# Patient Record
Sex: Male | Born: 1980 | Race: White | Hispanic: No | Marital: Single | Smoking: Never smoker
Health system: Southern US, Community
[De-identification: ages and names within clinical notes are randomized; demographics above are authoritative.]

## PROBLEM LIST (undated history)

## (undated) DIAGNOSIS — E785 Hyperlipidemia, unspecified: Secondary | ICD-10-CM

## (undated) DIAGNOSIS — K219 Gastro-esophageal reflux disease without esophagitis: Secondary | ICD-10-CM

## (undated) HISTORY — PX: HERNIA REPAIR: SHX51

---

## 2015-03-07 ENCOUNTER — Ambulatory Visit
Admission: EM | Admit: 2015-03-07 | Discharge: 2015-03-07 | Disposition: A | Discharge status: Home / Self Care | Payer: BLUE CROSS/BLUE SHIELD | Attending: Family Medicine | Admitting: Family Medicine

## 2015-03-07 ENCOUNTER — Ambulatory Visit: Payer: BLUE CROSS/BLUE SHIELD

## 2015-03-07 DIAGNOSIS — J209 Acute bronchitis, unspecified: Secondary | ICD-10-CM | POA: Diagnosis not present

## 2015-03-07 DIAGNOSIS — R05 Cough: Secondary | ICD-10-CM | POA: Diagnosis present

## 2015-03-07 MED ORDER — PREDNISONE 10 MG PO TABS: ORAL_TABLET | ORAL | Status: DC

## 2015-03-07 MED ORDER — ALBUTEROL SULFATE HFA 108 (90 BASE) MCG/ACT IN AERS: 2.0000 | INHALATION_SPRAY | RESPIRATORY_TRACT | Status: DC | PRN

## 2015-03-07 NOTE — Discharge Instructions (Signed)

## 2015-03-07 NOTE — ED Provider Notes (Signed)
CSN: [id number]     Arrival date & time 03/07/15  1222 History   First MD Initiated Contact with Patient 03/07/15 1304     Chief Complaint  Patient presents with  . Cough  . URI   (Consider location/radiation/quality/duration/timing/severity/associated sxs/prior Treatment) HPI        34 year old male presents complaining of cough for one week. This started as a sore throat which has resolved. Now he has a cough that is productive of sputum. He denies any fever, chills, NVD, chest pain, or shortness of breath. He has been around his mom who had a similar condition with a cough but he says hers was viral, he thinks he has a bacterial infection because the cough is productive. No recent travel. 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 No past medical history. He does not smoke  History reviewed. No pertinent past medical history. History reviewed. No pertinent past surgical history. History reviewed. No pertinent family history. History  Substance Use Topics  . Smoking status: Never Smoker   . Smokeless tobacco: Never Used  . Alcohol Use: 1.2 oz/week    2 Cans of beer per week     Comment: occasional    Review of Systems  Constitutional: Negative for fever and chills.  HENT: Positive for sore throat.   Respiratory: Positive for cough and chest tightness. Negative for shortness of breath.   Cardiovascular: Negative for chest pain.  Gastrointestinal: Negative for nausea, vomiting, abdominal pain and diarrhea.  Genitourinary: Positive for flank pain (mild, bilateral).  All other systems reviewed and are negative.   Allergies  Review of patient's allergies indicates no known allergies.  Home Medications   Prior to Admission medications   Medication Sig Start Date End Date Taking? Authorizing Provider  albuterol (PROVENTIL HFA;VENTOLIN HFA) 108 (90 BASE) MCG/ACT inhaler Inhale 2 puffs into the lungs every 4 (four) hours as needed for wheezing.  03/07/15   Graylon GoodZachary H Io Dieujuste, PA-C  predniSONE (DELTASONE) 10 MG tablet 4 tabs PO QD for 4 days; 3 tabs PO QD for 3 days; 2 tabs PO QD for 2 days; 1 tab PO QD for 1 day 03/07/15   Graylon GoodZachary H Imogene Gravelle, PA-C   BP 123/83 mmHg  Pulse 72  Temp(Src) 97 F (36.1 C) (Tympanic)  Resp 20  Ht 6\' 5"  (1.956 m)  Wt 210 lb (95.255 kg)  BMI 24.90 kg/m2  SpO2 100% Physical Exam  Constitutional: He is oriented to person, place, and time. He appears well-developed and well-nourished. No distress.  HENT:  Head: Normocephalic and atraumatic.  Right Ear: External ear normal.  Left Ear: External ear normal.  Nose: Nose normal. Right sinus exhibits no maxillary sinus tenderness and no frontal sinus tenderness. Left sinus exhibits no maxillary sinus tenderness and no frontal sinus tenderness.  Mouth/Throat: Uvula is midline and mucous membranes are normal. Posterior oropharyngeal erythema (mild) present. No oropharyngeal exudate.  Eyes: Conjunctivae are normal.  Neck: Normal range of motion. Neck supple.  Cardiovascular: Normal rate, regular rhythm and normal heart sounds.   Pulmonary/Chest: Effort normal and breath sounds normal. No respiratory distress. He exhibits no tenderness.  Lymphadenopathy:    He has no cervical adenopathy.  Neurological: He is alert and oriented to person, place, and time. Coordination normal.  Skin: Skin is warm and dry. No rash noted. He is not diaphoretic.  Psychiatric: He has a normal mood and affect. Judgment normal.  Nursing note and vitals reviewed.  ED Course  Procedures (including critical care time) Labs Review Labs Reviewed - No data to display  Imaging Review Dg Chest 2 View  03/07/2015   CLINICAL DATA:  Productive cough for 4-5 days.  EXAM: CHEST  2 VIEW  COMPARISON:  None.  FINDINGS: The cardiomediastinal silhouette is unremarkable.  There is no evidence of focal airspace disease, pulmonary edema, suspicious pulmonary nodule/mass, pleural effusion, or pneumothorax. No  acute bony abnormalities are identified.  IMPRESSION: No active cardiopulmonary disease.   Electronically Signed   By: Harmon PierJeffrey  Hu M.D.   On: 03/07/2015 13:43     MDM   1. Acute bronchitis, unspecified organism    X-rays negative for any consolidation or pneumonia. Most likely viral. Treat with prednisone and albuterol inhaler, also advised her to take Mucinex DM and increased fluids. Follow-up again if he has any worsening in his condition.  Meds ordered this encounter  Medications  . predniSONE (DELTASONE) 10 MG tablet    Sig: 4 tabs PO QD for 4 days; 3 tabs PO QD for 3 days; 2 tabs PO QD for 2 days; 1 tab PO QD for 1 day    Dispense:  30 tablet    Refill:  0  . albuterol (PROVENTIL HFA;VENTOLIN HFA) 108 (90 BASE) MCG/ACT inhaler    Sig: Inhale 2 puffs into the lungs every 4 (four) hours as needed for wheezing.    Dispense:  1 Inhaler    Refill:  0        Graylon GoodZachary H Lannis Lichtenwalner, PA-C 03/07/15 1402

## 2015-03-07 NOTE — ED Notes (Signed)
Patient has been sick for the past week with cough/congestion, Productive cough greenish sputum

## 2020-01-15 ENCOUNTER — Encounter: Payer: Self-pay | Admitting: Emergency Medicine

## 2020-01-15 ENCOUNTER — Ambulatory Visit
Admission: EM | Admit: 2020-01-15 | Discharge: 2020-01-15 | Disposition: A | Discharge status: Home / Self Care | Payer: 59 | Attending: Family Medicine | Admitting: Family Medicine

## 2020-01-15 ENCOUNTER — Ambulatory Visit (INDEPENDENT_AMBULATORY_CARE_PROVIDER_SITE_OTHER): Payer: 59

## 2020-01-15 ENCOUNTER — Other Ambulatory Visit: Payer: Self-pay

## 2020-01-15 DIAGNOSIS — R0789 Other chest pain: Secondary | ICD-10-CM | POA: Insufficient documentation

## 2020-01-15 DIAGNOSIS — I471 Supraventricular tachycardia: Secondary | ICD-10-CM | POA: Diagnosis not present

## 2020-01-15 DIAGNOSIS — E785 Hyperlipidemia, unspecified: Secondary | ICD-10-CM | POA: Diagnosis not present

## 2020-01-15 DIAGNOSIS — Z20822 Contact with and (suspected) exposure to covid-19: Secondary | ICD-10-CM | POA: Insufficient documentation

## 2020-01-15 DIAGNOSIS — Z8249 Family history of ischemic heart disease and other diseases of the circulatory system: Secondary | ICD-10-CM | POA: Diagnosis not present

## 2020-01-15 DIAGNOSIS — R5383 Other fatigue: Secondary | ICD-10-CM | POA: Diagnosis not present

## 2020-01-15 DIAGNOSIS — K219 Gastro-esophageal reflux disease without esophagitis: Secondary | ICD-10-CM | POA: Diagnosis not present

## 2020-01-15 DIAGNOSIS — R0602 Shortness of breath: Secondary | ICD-10-CM | POA: Insufficient documentation

## 2020-01-15 DIAGNOSIS — Z79899 Other long term (current) drug therapy: Secondary | ICD-10-CM | POA: Diagnosis not present

## 2020-01-15 HISTORY — DX: Hyperlipidemia, unspecified: E78.5

## 2020-01-15 HISTORY — DX: Gastro-esophageal reflux disease without esophagitis: K21.9

## 2020-01-15 LAB — SARS CORONAVIRUS 2 (TAT 6-24 HRS): SARS Coronavirus 2: NEGATIVE

## 2020-01-15 NOTE — ED Provider Notes (Signed)
[facility]    CSN: [id number] Arrival date & time: 01/15/20  1115      History   Chief Complaint Chief Complaint  Patient presents with  . Shortness of Breath  . Fatigue   HPI  39 year old male presents with shortness of breath, fatigue, and chest tightness.  Patient reports that this started on 19 March.  Patient had been working outside and had episode of tachycardia and associated diaphoresis.  He states that he was tachycardic in the 190s.  He tried Valsalva maneuvers and by the time EMS got there his heart rate had come down.  He was taken via EMS to the hospital.  His hospital records from 3/19 were reviewed and are summarized as follows:  Note reviewed.  Patient had unremarkable EKG.  Vital signs stable.  Patient remained stable throughout the visit.  Laboratory studies notable for creatinine of 1.5.  Patient was given IV fluids.  Thyroid studies negative.  CBC normal.  Toxicology screen negative.  This was thought to be secondary to SVT.  Discharged home with cardiology follow-up.  Patient has since followed up with cardiology.  Note unavailable at this time.  He states that he is scheduled for a visit with electrophysiology and has an echocardiogram in April.  Patient reports that he continues to not feel well.  He reports fatigue, shortness of breath and, chest tightness.  No further palpitations. He states that he did not have a chest x-ray nor did he have a Covid test.  He would like these today.  He states that he has a history of anxiety and is unsure if this is playing a role.  No relieving factors.  No other complaints or concerns at this time.  Past Medical History:  Diagnosis Date  . GERD (gastroesophageal reflux disease)   . Hyperlipidemia    Past Surgical History:  Procedure Laterality Date  . HERNIA REPAIR     Home Medications    Prior to Admission medications   Medication Sig Start Date End Date Taking? Authorizing Provider  Multiple Vitamin  (MULTIVITAMIN) capsule Take 1 capsule by mouth daily.   Yes [provider]  omeprazole (PRILOSEC) 20 MG capsule Take 20 mg by mouth daily.   Yes [provider]  albuterol (PROVENTIL HFA;VENTOLIN HFA) 108 (90 BASE) MCG/ACT inhaler Inhale 2 puffs into the lungs every 4 (four) hours as needed for wheezing. 03/07/15 01/15/20  Liam Graham, PA-C    Family History Family History  Problem Relation Age of Onset  . Hypertension Mother   . CAD Father        stents x 4-5  . Hyperlipidemia Father     Social History Social History   Tobacco Use  . Smoking status: Never Smoker  . Smokeless tobacco: Never Used  Substance Use Topics  . Alcohol use: Yes    Alcohol/week: 2.0 standard drinks    Types: 2 Cans of beer per week    Comment: occasional  . Drug use: No     Allergies   Patient has no known allergies.   Review of Systems Review of Systems  Constitutional: Positive for fatigue.  Respiratory: Positive for chest tightness and shortness of breath.    Physical Exam Triage Vital Signs ED Triage Vitals  Enc Vitals Group     BP 01/15/20 1140 110/81     Pulse Rate 01/15/20 1140 84     Resp 01/15/20 1140 18     Temp 01/15/20 1140 98.4 F (36.9  C)     Temp Source 01/15/20 1140 Oral     SpO2 01/15/20 1140 100 %     Weight 01/15/20 1140 215 lb (97.5 kg)     Height 01/15/20 1140 6\' 4"  (1.93 m)     Head Circumference --      Peak Flow --      Pain Score 01/15/20 1139 4     Pain Loc --      Pain Edu? --      Excl. in GC? --    Updated Vital Signs BP 110/81 (BP Location: Left Arm)   Pulse 84   Temp 98.4 F (36.9 C) (Oral)   Resp 18   Ht 6\' 4"  (1.93 m)   Wt 97.5 kg   SpO2 100%   BMI 26.17 kg/m   Visual Acuity Right Eye Distance:   Left Eye Distance:   Bilateral Distance:    Right Eye Near:   Left Eye Near:    Bilateral Near:     Physical Exam Vitals and nursing note reviewed.  Constitutional:      General: He is not in acute distress.     Appearance: Normal appearance. He is not ill-appearing.  HENT:     Head: Normocephalic and atraumatic.  Eyes:     General:        Right eye: No discharge.        Left eye: No discharge.     Conjunctiva/sclera: Conjunctivae normal.  Cardiovascular:     Rate and Rhythm: Normal rate and regular rhythm.     Heart sounds: No murmur.  Pulmonary:     Effort: Pulmonary effort is normal.     Breath sounds: Normal breath sounds. No wheezing, rhonchi or rales.  Neurological:     Mental Status: He is alert.  Psychiatric:        Mood and Affect: Mood normal.        Behavior: Behavior normal.    UC Treatments / Results  Labs (all labs ordered are listed, but only abnormal results are displayed) Labs Reviewed  SARS CORONAVIRUS 2 (TAT 6-24 HRS)    EKG   Radiology DG Chest 2 View  Result Date: 01/15/2020 CLINICAL DATA:  39 year old with shortness of breath. EXAM: CHEST - 2 VIEW COMPARISON:  01/05/2015 FINDINGS: The heart size and mediastinal contours are within normal limits. Both lungs are clear. The visualized skeletal structures are unremarkable. IMPRESSION: No active cardiopulmonary disease. Electronically Signed   By: 33 M.D.   On: 01/15/2020 12:17    Procedures Procedures (including critical care time)  Medications Ordered in UC Medications - No data to display  Initial Impression / Assessment and Plan / UC Course  I have reviewed the triage vital signs and the nursing notes.  Pertinent labs & imaging results that were available during my care of the patient were reviewed by me and considered in my medical decision making (see chart for details).    39 year old male presents with shortness of breath, chest tightness, fatigue.  Etiology and prognosis remains uncertain at this time.  Recent hospital records reviewed.  See HPI.  Recent labs reviewed, see HPI.  Chest x-ray obtained today and was normal.  Chest x-ray independently interpreted by me.  Interpretation: No acute  cardiopulmonary findings.  No evidence of pneumonia.  Awaiting Covid test results.  Advised follow-up with cardiology.  Work note given.  Supportive care.  Final Clinical Impressions(s) / UC Diagnoses   Final diagnoses:  SOB (  shortness of breath)  SVT (supraventricular tachycardia) (HCC)     Discharge Instructions     Rest.  Xray negative.  Awaiting COVID test.  Follow up with Cardiology.  Take care  Dr. Adriana Simas    ED Prescriptions    None     PDMP not reviewed this encounter.   Tommie Sams, [place] 01/15/20 1326

## 2020-01-15 NOTE — ED Triage Notes (Signed)
Patient in today c/o sob, fatigue, tachycardia and chest pain x 5 days. Patient went to [facility] ED 01/10/20 and follow up with cardiology 01/13/20. Patient continues to have sob and fatigue. Patient states he did not have a CXR or covid test in the ED.

## 2020-01-15 NOTE — Discharge Instructions (Signed)
Rest.  Xray negative.  Awaiting COVID test.  Follow up with Cardiology.  Take care  Dr. Adriana Simas

## 2021-09-15 IMAGING — CR DG CHEST 2V
2 series · 4 of 4 positions shown · non-contrast
Comparison: 01/05/2015

CLINICAL DATA: 38-year-old with shortness of breath.

EXAM:
CHEST - 2 VIEW

[Series 1: chest pa · 0.14mm/px · 2 of 2 slices shown]
[im 1/2]
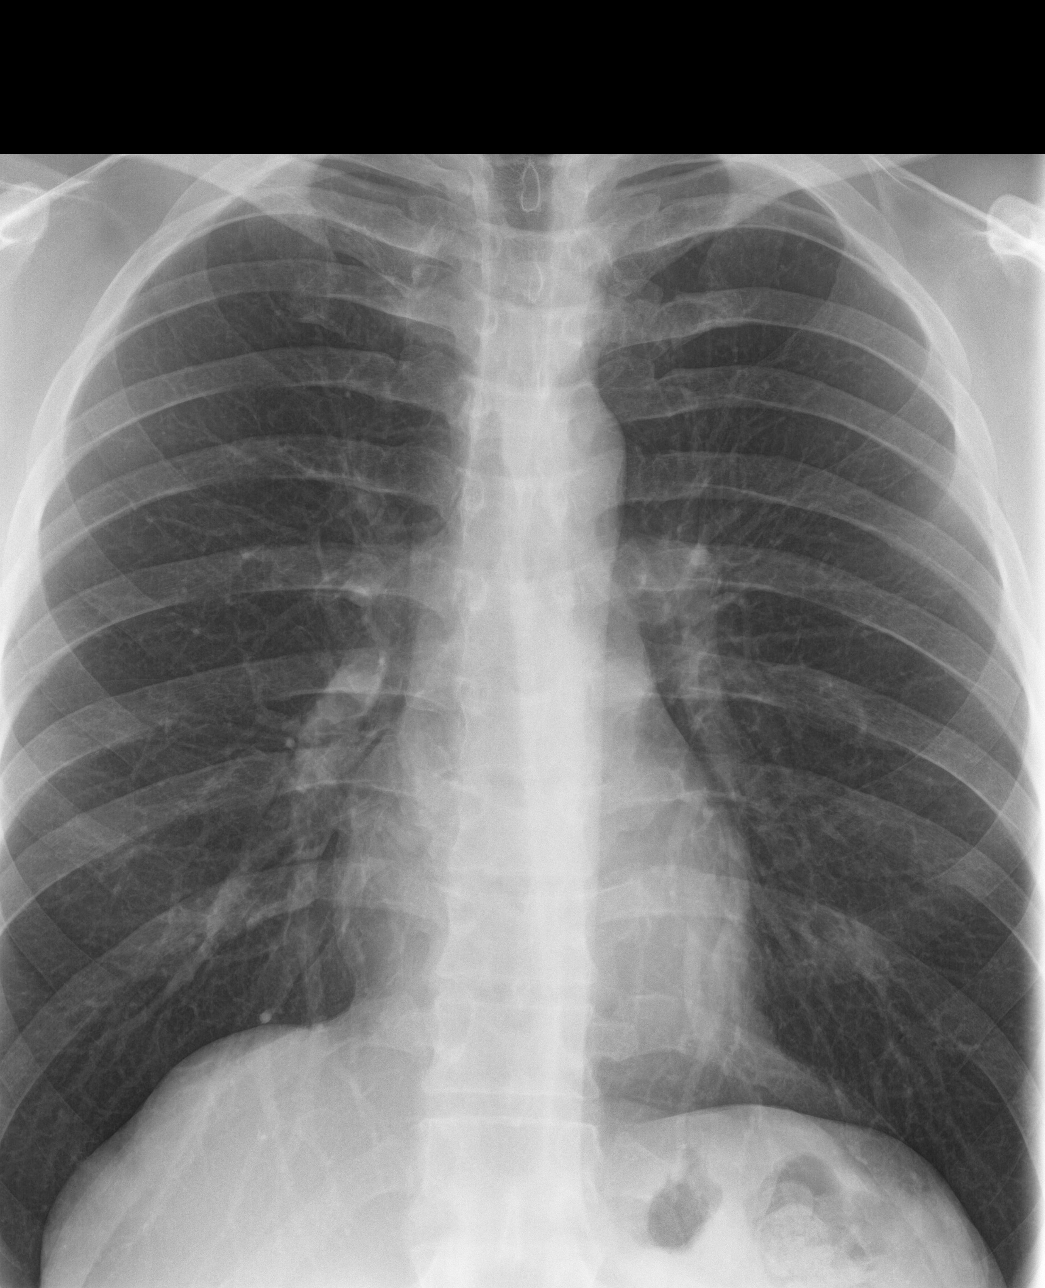
[im 2/2]
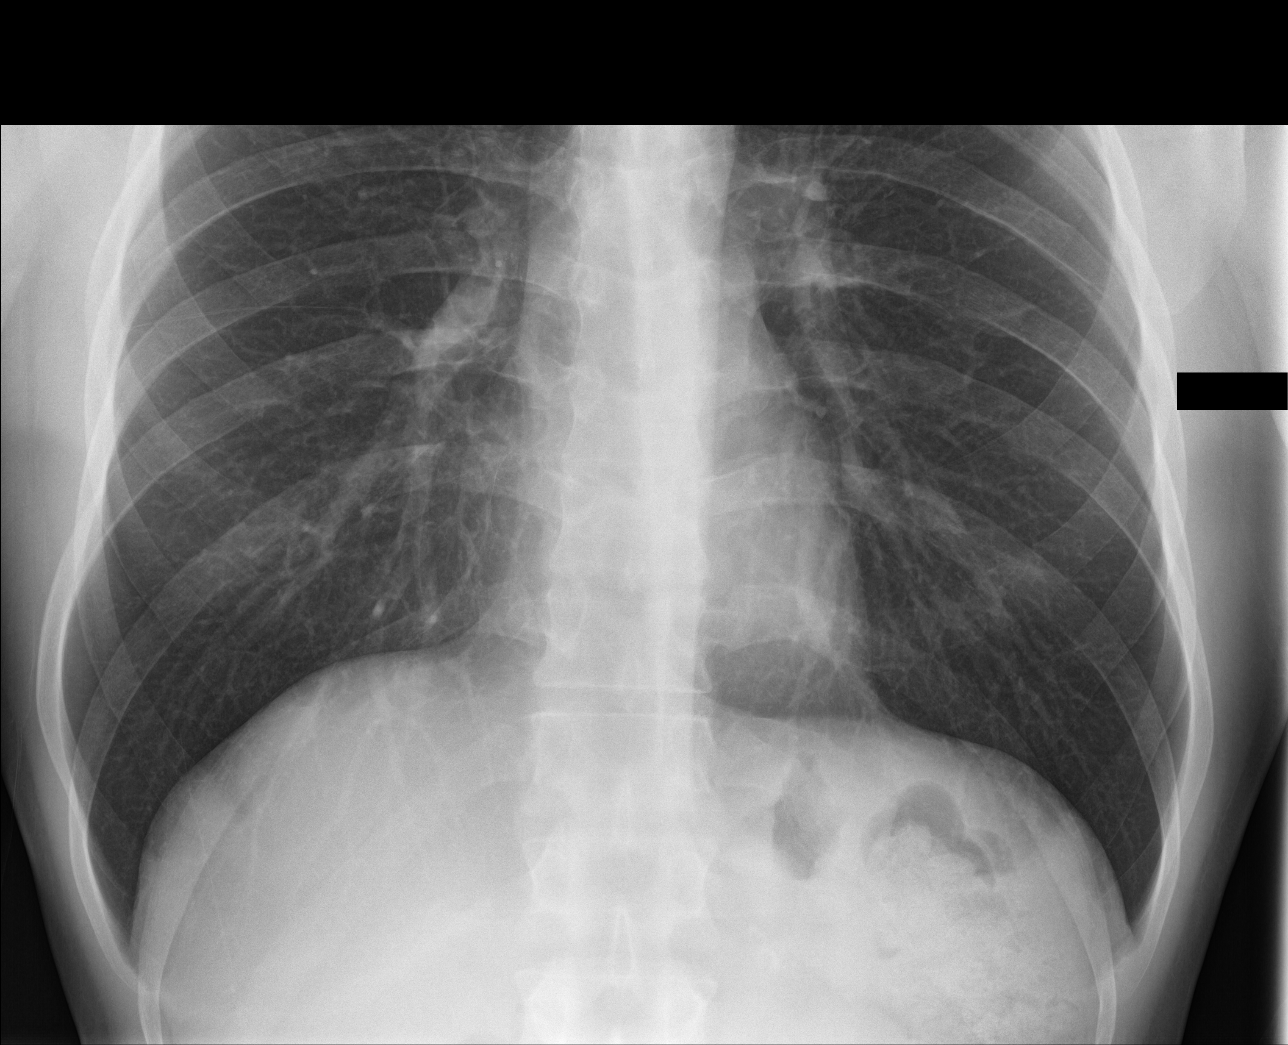

[Series 2: chest lat · 0.14mm/px · 2 of 2 slices shown]
[im 1/2]
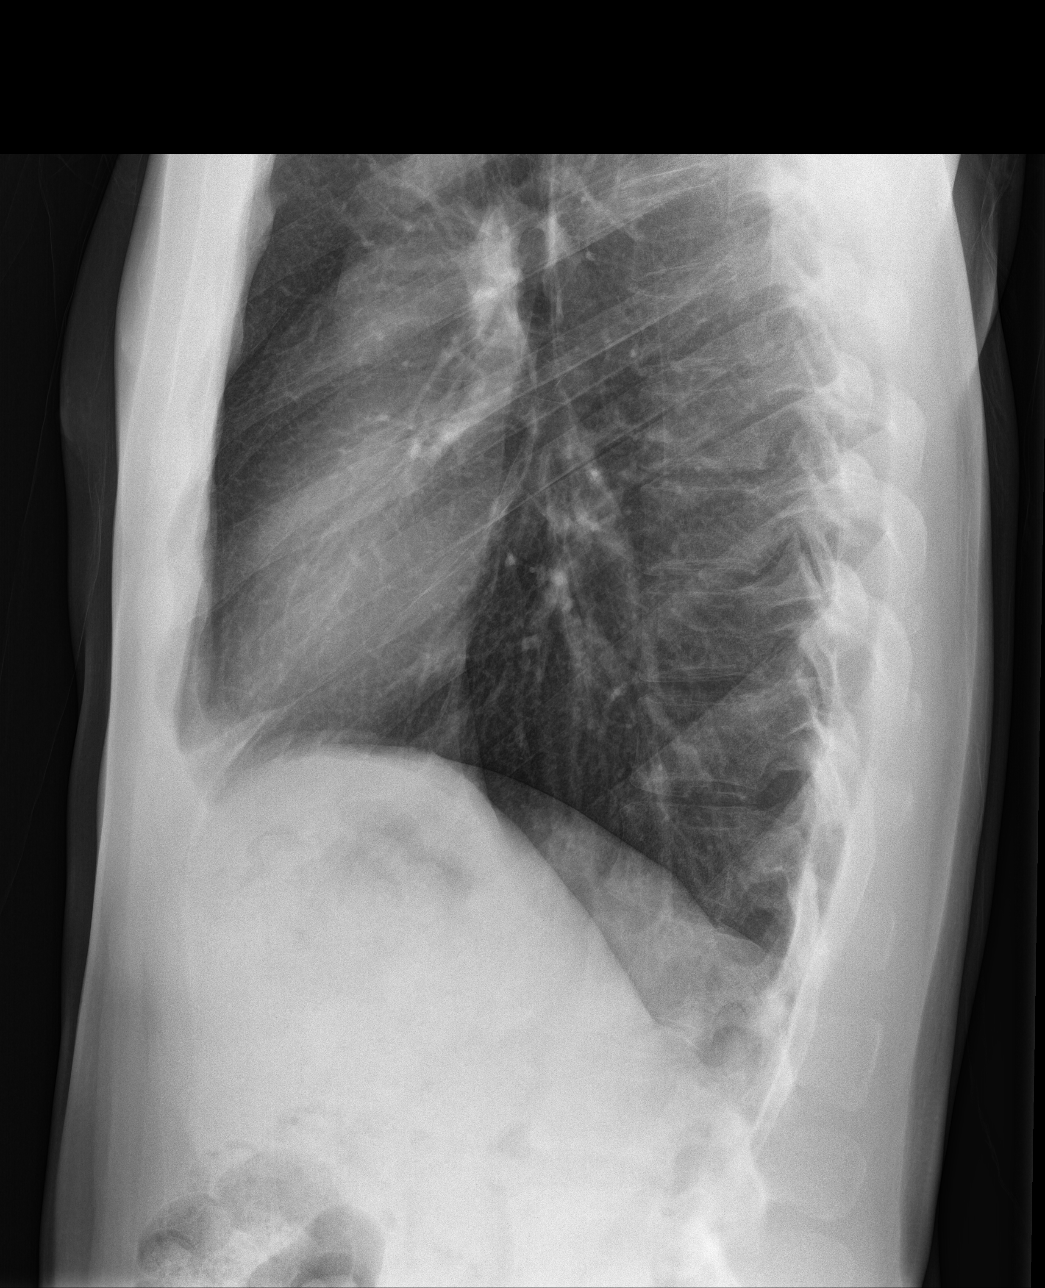
[im 2/2]
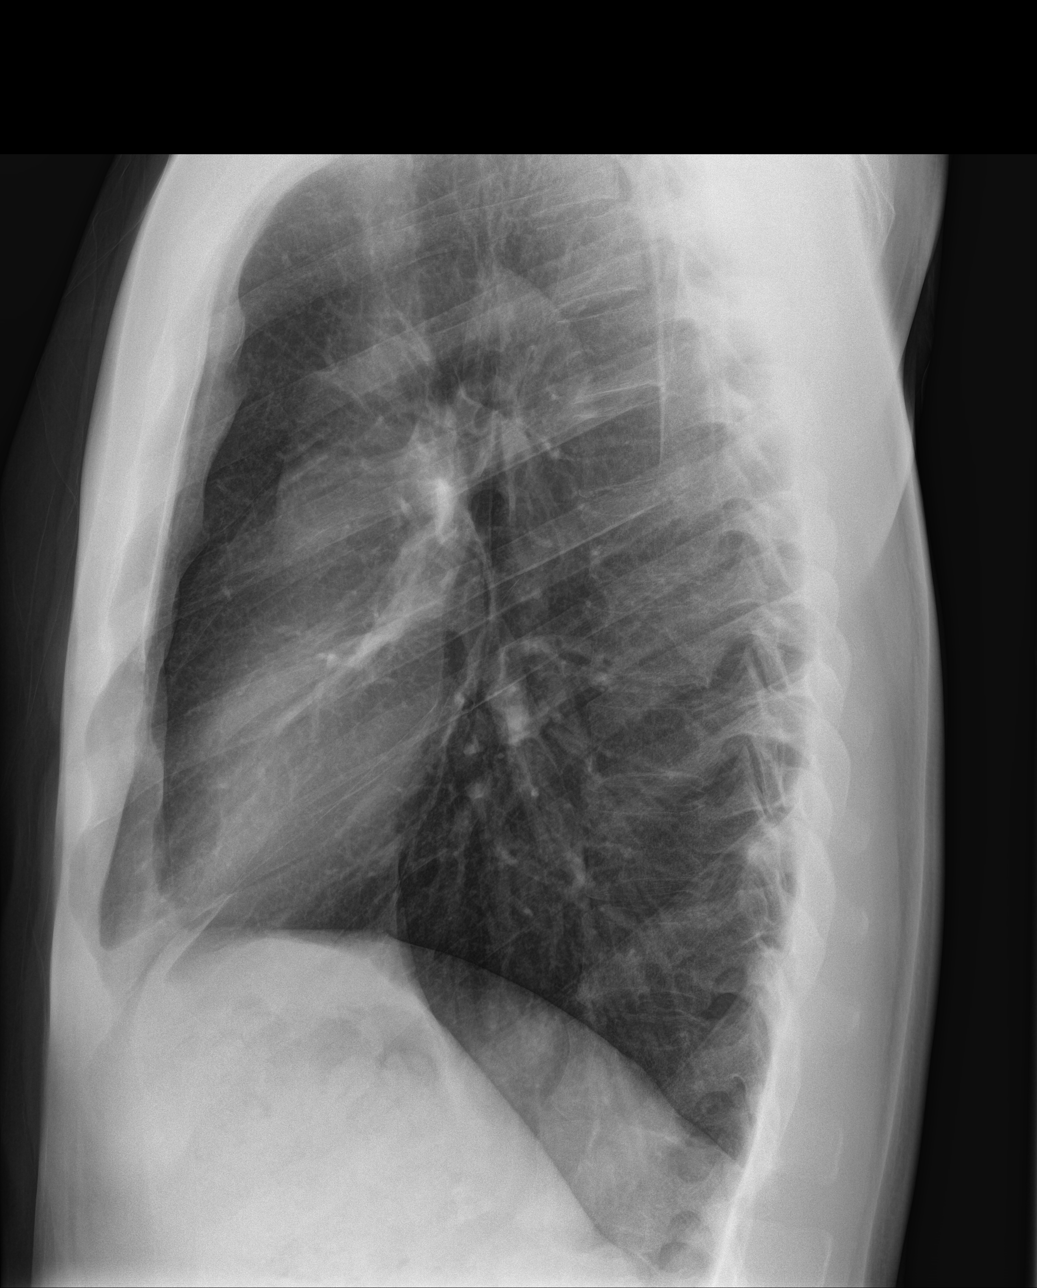

[4 of 4 positions shown; findings below may reference images not displayed]

FINDINGS: The heart size and mediastinal contours are within normal limits.
Both lungs are clear. The visualized skeletal structures are
unremarkable.
IMPRESSION: No active cardiopulmonary disease.
